# Patient Record
Sex: Female | Born: 1965 | Race: Black or African American | Hispanic: No | Marital: Single | Smoking: Current every day smoker
Health system: Southern US, Community
[De-identification: ages and names within clinical notes are randomized; demographics above are authoritative.]

## PROBLEM LIST (undated history)

## (undated) DIAGNOSIS — I10 Essential (primary) hypertension: Secondary | ICD-10-CM

## (undated) DIAGNOSIS — E669 Obesity, unspecified: Secondary | ICD-10-CM

## (undated) HISTORY — DX: Essential (primary) hypertension: I10

## (undated) HISTORY — DX: Obesity, unspecified: E66.9

---

## 2009-01-14 ENCOUNTER — Emergency Department (HOSPITAL_COMMUNITY): Admission: EM | Admit: 2009-01-14 | Discharge: 2009-01-14 | Payer: Self-pay | Admitting: Emergency Medicine

## 2009-11-26 ENCOUNTER — Telehealth: Payer: Self-pay | Admitting: Family Medicine
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
## 2011-04-14 ENCOUNTER — Ambulatory Visit: Payer: Self-pay | Admitting: Family Medicine

## 2011-05-06 ENCOUNTER — Encounter: Payer: Self-pay | Admitting: Family Medicine

## 2011-05-06 ENCOUNTER — Ambulatory Visit (INDEPENDENT_AMBULATORY_CARE_PROVIDER_SITE_OTHER): Payer: Self-pay | Admitting: Family Medicine

## 2011-05-06 VITALS — BP 172/99 | HR 91 | Temp 98.2°F | Ht 66.0 in | Wt 227.0 lb

## 2011-05-06 DIAGNOSIS — R209 Unspecified disturbances of skin sensation: Secondary | ICD-10-CM

## 2011-05-06 DIAGNOSIS — F17200 Nicotine dependence, unspecified, uncomplicated: Secondary | ICD-10-CM

## 2011-05-06 DIAGNOSIS — R202 Paresthesia of skin: Secondary | ICD-10-CM | POA: Insufficient documentation

## 2011-05-06 DIAGNOSIS — N926 Irregular menstruation, unspecified: Secondary | ICD-10-CM

## 2011-05-06 DIAGNOSIS — N939 Abnormal uterine and vaginal bleeding, unspecified: Secondary | ICD-10-CM | POA: Insufficient documentation

## 2011-05-06 DIAGNOSIS — I10 Essential (primary) hypertension: Secondary | ICD-10-CM

## 2011-05-06 DIAGNOSIS — Z720 Tobacco use: Secondary | ICD-10-CM

## 2011-05-06 DIAGNOSIS — Z716 Tobacco abuse counseling: Secondary | ICD-10-CM | POA: Insufficient documentation

## 2011-05-06 DIAGNOSIS — Z23 Encounter for immunization: Secondary | ICD-10-CM

## 2011-05-06 MED ORDER — LISINOPRIL-HYDROCHLOROTHIAZIDE 10-12.5 MG PO TABS: 1.0000 | ORAL_TABLET | Freq: Every day | ORAL | Status: DC

## 2011-05-06 NOTE — Patient Instructions (Addendum)
It was nice to meet you today!  I am giving you a new prescription to take to [facility] to have filled for your blood pressure. Please keep a record of all the blood pressure checks you get done between now and our next visit. I would like for you to come back to see me in 4 weeks for a follow-up.   I will get the records from [facility] and the health department to discuss at your next visit as well.  Please let me know if you need anything else!  Aldous Housel M. Fumiye Lubben, M.D.

## 2011-05-06 NOTE — Assessment & Plan Note (Signed)
Last pap smear was at the HD. We have requested those records be sent to [place]. Given this has been going on for 2 years, it is unlikely an acute process but her mother did die of cancer that began as cervical which is concerning. At her next visit, will consider doing a pelvic exam. Patient is not a candidate for birth control since she is 45 yo and smokes. Will continue to monitor.

## 2011-05-06 NOTE — Assessment & Plan Note (Signed)
Known history of HTN. BP was 172/99 in the office today. We discussed the effects of high blood pressure on her body and she understands. Encouraged her to change her diet, increase her exercise, decrease sodium and stop smoking. Started Lisinopril/HCT today which she will have filled at [facility]. Since she had recent labs at [facility], she signed a medical release form so we can get those results. We will discuss those at her next visit. If they are unable to send the results, we will need to draw basic labs. Since she is checking her BP at the drug store, I have asked her to record the measurements so we can track her progress. Other things to discuss with her will be nutrition consult and overall weight loss

## 2011-05-06 NOTE — Assessment & Plan Note (Signed)
Patient is ready to quit. She will try 1800QUITNOW until I see her back in 3-4 weeks. At that time, we will discuss the possibility of trying a medication or patch, depending on how much they cost. She is also interested in seeing Dr. Raymondo Band in pharmacy clinic which we can discuss after she cuts back more on her own. I feel she is very motivated to quit and make like changes.

## 2011-05-06 NOTE — Progress Notes (Signed)
Subjective:    Patient ID: Erica Cohen, female    DOB: 1966-06-07, 45 y.o.   MRN: [id number]  HPI  Patient presents to clinic today to establish care. She was previously not seen by a provider on a regular basis. She does have a history of hypertension but is not currently on medications. She has recently been to [facility] for abdominal pain, and had a pap smear done at the [facility]. Her concerns today are as follows:  1. Heavy menstrual bleeding: Pt states that in the last 2 years, her menstrual periods have become heavier with increased cramping. She states she has many large clots with her periods. The pain is so bad that it interferes with her daily activities. She has taken birth control in the past which helped with her periods. Her LMP was 10/6-10/10 which she states was heavy as well. She was seen at [facility] for abdominal pain a few weeks ago. They did an abdominal ultrasound (not pelvic) which was inconclusive, per the patient. She also had labs at that time but she is not aware of anything that was abnormal. Her last pap smear was at the health dept 6 months ago. Her mother did die of cervical cancer.  2. Hypertension: History of HTN diagnosed at age 26 during her first pregnancy. She was started on medications at that time, but she has not taken anything since she lost her health insurance. She does check her BP at local drug stores and she states it is always high. (172/102 yesterday at [facility]) She does notice headaches, blurred vision when her blood pressure is high. She states since she does not have any blood pressure medication, she has been taking her sister's medications but she is not sure what they are.   3. Arm tingling: Right arm only. Comes and goes. Usually 2-3 times per week and last a few minutes. She does not wake up in the middle of the night with hand pain. This is her entire arm. It feels like it "goes to sleep" then  resolves. She feels it is associated with high blood pressure.  4. Tobacco dependence: Patient smokes 6 cigarettes per day. She states she is ready to quit. She has been gradually cutting back. She states that she feels like since she has quit drugs and alcohol, she is able to quit smoking as well. She seems very motivated to do this.   Review of Systems  Constitutional: Negative for fever, activity change and appetite change.  HENT: Positive for congestion and rhinorrhea. Negative for neck pain.   Respiratory: Negative for cough and chest tightness.   Cardiovascular: Positive for leg swelling. Negative for chest pain.  Gastrointestinal: Negative for abdominal pain.  Genitourinary: Positive for vaginal bleeding.  Musculoskeletal: Negative for back pain.  Skin: Negative for rash.       Objective:   Physical Exam  Constitutional: She is oriented to person, place, and time. She appears well-developed and well-nourished. No distress.  HENT:  Head: Normocephalic and atraumatic.  Eyes: Pupils are equal, round, and reactive to light.  Neck: Normal range of motion. Neck supple.  Cardiovascular: Normal rate, regular rhythm and normal heart sounds.   No murmur heard. Pulmonary/Chest: Effort normal. No respiratory distress. She has no wheezes.  Abdominal: Soft. She exhibits no distension. There is no tenderness.  Musculoskeletal: Normal range of motion. She exhibits edema (1+ bilaterally).  Neurological: She is alert and oriented to person, place, and  time. No cranial nerve deficit. She exhibits normal muscle tone.  Skin: Skin is warm and dry. She is not diaphoretic.  Psychiatric: She has a normal mood and affect.          Assessment & Plan:

## 2011-05-06 NOTE — Assessment & Plan Note (Signed)
Did not do work-up today. Patient will get better control of BP then see how she is doing.

## 2011-06-08 ENCOUNTER — Ambulatory Visit: Payer: Self-pay | Admitting: Family Medicine

## 2011-06-10 ENCOUNTER — Emergency Department (HOSPITAL_COMMUNITY)
Admission: EM | Admit: 2011-06-10 | Discharge: 2011-06-10 | Disposition: A | Discharge status: Home / Self Care | Payer: No Typology Code available for payment source | Attending: Emergency Medicine | Admitting: Emergency Medicine

## 2011-06-10 ENCOUNTER — Encounter (HOSPITAL_COMMUNITY): Payer: Self-pay | Admitting: Nurse Practitioner

## 2011-06-10 DIAGNOSIS — Z79899 Other long term (current) drug therapy: Secondary | ICD-10-CM | POA: Insufficient documentation

## 2011-06-10 DIAGNOSIS — M542 Cervicalgia: Secondary | ICD-10-CM | POA: Insufficient documentation

## 2011-06-10 DIAGNOSIS — F29 Unspecified psychosis not due to a substance or known physiological condition: Secondary | ICD-10-CM | POA: Insufficient documentation

## 2011-06-10 DIAGNOSIS — T148XXA Other injury of unspecified body region, initial encounter: Secondary | ICD-10-CM

## 2011-06-10 DIAGNOSIS — S139XXA Sprain of joints and ligaments of unspecified parts of neck, initial encounter: Secondary | ICD-10-CM | POA: Insufficient documentation

## 2011-06-10 DIAGNOSIS — I10 Essential (primary) hypertension: Secondary | ICD-10-CM | POA: Insufficient documentation

## 2011-06-10 MED ORDER — IBUPROFEN 800 MG PO TABS: 800.0000 mg | ORAL_TABLET | Freq: Three times a day (TID) | ORAL | Status: AC | PRN

## 2011-06-10 MED ORDER — TRAMADOL HCL 50 MG PO TABS: 50.0000 mg | ORAL_TABLET | Freq: Four times a day (QID) | ORAL | Status: AC | PRN

## 2011-06-10 MED ORDER — TRAMADOL HCL 50 MG PO TABS: 50.0000 mg | ORAL_TABLET | Freq: Four times a day (QID) | ORAL | Status: DC | PRN

## 2011-06-10 MED ORDER — METHOCARBAMOL 500 MG PO TABS: 1000.0000 mg | ORAL_TABLET | Freq: Four times a day (QID) | ORAL | Status: AC
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
## 2011-06-10 NOTE — ED Provider Notes (Signed)
Medical screening examination/treatment/procedure(s) were performed by non-physician practitioner and as supervising physician I was immediately available for consultation/collaboration.  Jedd Schulenburg, MD 06/10/11 2121 
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
## 2011-07-05 ENCOUNTER — Ambulatory Visit: Payer: Self-pay | Admitting: Family Medicine

## 2011-07-16 ENCOUNTER — Ambulatory Visit: Payer: Self-pay | Admitting: Family Medicine

## 2011-07-27 LAB — HM MAMMOGRAPHY

## 2011-10-29 ENCOUNTER — Ambulatory Visit (INDEPENDENT_AMBULATORY_CARE_PROVIDER_SITE_OTHER): Payer: No Typology Code available for payment source | Admitting: Family Medicine

## 2011-10-29 ENCOUNTER — Telehealth: Payer: Self-pay | Admitting: Family Medicine

## 2011-10-29 ENCOUNTER — Encounter: Payer: Self-pay | Admitting: Family Medicine

## 2011-10-29 ENCOUNTER — Ambulatory Visit (HOSPITAL_COMMUNITY)
Admission: RE | Admit: 2011-10-29 | Discharge: 2011-10-29 | Disposition: A | Discharge status: Home / Self Care | Payer: No Typology Code available for payment source | Source: Ambulatory Visit | Attending: Family Medicine | Admitting: Family Medicine

## 2011-10-29 ENCOUNTER — Other Ambulatory Visit: Payer: Self-pay

## 2011-10-29 VITALS — BP 132/86 | HR 82 | Ht 66.0 in | Wt 232.0 lb

## 2011-10-29 DIAGNOSIS — R079 Chest pain, unspecified: Secondary | ICD-10-CM | POA: Insufficient documentation

## 2011-10-29 DIAGNOSIS — R05 Cough: Secondary | ICD-10-CM | POA: Insufficient documentation

## 2011-10-29 DIAGNOSIS — I10 Essential (primary) hypertension: Secondary | ICD-10-CM | POA: Insufficient documentation

## 2011-10-29 DIAGNOSIS — Z87891 Personal history of nicotine dependence: Secondary | ICD-10-CM | POA: Insufficient documentation

## 2011-10-29 DIAGNOSIS — R059 Cough, unspecified: Secondary | ICD-10-CM | POA: Insufficient documentation

## 2011-10-29 DIAGNOSIS — R0789 Other chest pain: Secondary | ICD-10-CM | POA: Insufficient documentation

## 2011-10-29 DIAGNOSIS — R9431 Abnormal electrocardiogram [ECG] [EKG]: Secondary | ICD-10-CM | POA: Insufficient documentation

## 2011-10-29 MED ORDER — LISINOPRIL-HYDROCHLOROTHIAZIDE 10-12.5 MG PO TABS: 1.0000 | ORAL_TABLET | Freq: Every day | ORAL | Status: DC

## 2011-10-29 MED ORDER — TRAMADOL HCL 50 MG PO TABS: 50.0000 mg | ORAL_TABLET | Freq: Four times a day (QID) | ORAL | Status: AC | PRN
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
## 2011-10-29 NOTE — Progress Notes (Signed)
Patient ID: Erica Cohen, female   DOB: 10/13/1965, 46 y.o.   MRN: [id number] Patient ID: Erica Cohen    DOB: 1966/05/02, 46 y.o.   MRN: [id number] --- Subjective:  Erica Cohen is a 46 y.o.female with h/o HTN who presents with 2 week history of right sided chest pain. It is sharp shooting, lasts a couple seconds and goes away. It is not worst with movement or exercise. She actually doesn't feel any pain when she works out on. The pain radiates under her right breast to her back. Pain is worst at night and keeps her up. She has not been able to sleep due to the pain. The pain wakes her up at night. Occurs at least 3-4 times daily. She denies any trauma to the chest, any abnormal movement recently. She has been taking ibuprofen 800mg  three times daily for 2 weeks. Ibuprofen helps pain subside but pain does recur.  She denies any nausea, vomiting, diaphoresis.  She has also been having some associated shortness of breath when the pain happens. No shortness of breath with exercise. No fevers or chills. No sinus congestion, no cough.  She denies any anxiety. Denies any symptoms of reflux or indigestion. No abdominal pain.    Objective: Filed Vitals:   10/29/11 1047  BP: 132/86  Pulse: 82    Physical Examination:   General appearance - alert, well appearing, and in no distress Nose - normal and patent, no erythema, discharge or polyps Mouth - mucous membranes moist, pharynx normal without lesions Neck - supple, no significant adenopathy Chest - clear to auscultation, no wheezes, rales or rhonchi, symmetric air entry.  Heart - normal rate, regular rhythm, normal S1, S2, no murmurs, rubs, clicks or gallops, Tenderness to palpation along right costochondral junction.  Abdomen - soft, nontender, nondistended, no masses or organomegaly Extremities - trace pedal edema

## 2011-10-29 NOTE — Patient Instructions (Addendum)
I am sorry that you are having this chest pain. It is most likely musculoskeletal, but we want to make sure that it isn't anything with your heart and your lungs.  The EKG looked normal.  I am sending you to get a chest xray. We will let you know if there's anything abnormal.   For the pain, I am sending in ultram to take every 6hrs as needed for pain. You can take 1 to 2 pills each time. Continue with tylenol 650mg  every 6 hours.   If the pain gets worst, if you are short of breath or in pain during exercise or light activity, if you have nausea, vomiting, please return to the clinic or to the ED.

## 2011-10-29 NOTE — Telephone Encounter (Signed)
Called patient to inform her that cxr was normal, but had a wrong number and could not reach her.

## 2011-10-29 NOTE — Assessment & Plan Note (Addendum)
Differential includes: costochondritis (most likely), pulmonary process such as pneumonia (although lack of fever and lack of focal findings on physical exam make it less likely) or pneumothorax, cardiac process (patient does have a remote history of "congestion around the heart" that she mentions, but no h/o CAD, although cardiac chest pain can present itself differently in women).  - EKG in office showed normal sinus, rate 60's, non specific T wave depressions in leads V1 and aVR but otherwise normal. No evidence of ischemia. No previous EKG for comparison available.  - will obtain CXR to rule out acute pulmonary process. - treat pain with ultram 50-100mg  q6hr and tylenol - close follow up with PCP

## 2011-12-07 ENCOUNTER — Ambulatory Visit: Payer: No Typology Code available for payment source | Admitting: Family Medicine

## 2012-01-07 ENCOUNTER — Ambulatory Visit: Payer: No Typology Code available for payment source

## 2012-01-28 ENCOUNTER — Ambulatory Visit (INDEPENDENT_AMBULATORY_CARE_PROVIDER_SITE_OTHER): Payer: Self-pay | Admitting: Family Medicine

## 2012-01-28 ENCOUNTER — Encounter: Payer: Self-pay | Admitting: Family Medicine

## 2012-01-28 VITALS — BP 142/91 | HR 90 | Temp 98.2°F | Ht 66.0 in | Wt 226.0 lb

## 2012-01-28 DIAGNOSIS — N949 Unspecified condition associated with female genital organs and menstrual cycle: Secondary | ICD-10-CM

## 2012-01-28 DIAGNOSIS — I10 Essential (primary) hypertension: Secondary | ICD-10-CM

## 2012-01-28 DIAGNOSIS — Z111 Encounter for screening for respiratory tuberculosis: Secondary | ICD-10-CM

## 2012-01-28 DIAGNOSIS — N938 Other specified abnormal uterine and vaginal bleeding: Secondary | ICD-10-CM | POA: Insufficient documentation

## 2012-01-28 DIAGNOSIS — Z0000 Encounter for general adult medical examination without abnormal findings: Secondary | ICD-10-CM

## 2012-01-28 MED ORDER — LISINOPRIL-HYDROCHLOROTHIAZIDE 10-12.5 MG PO TABS: 1.0000 | ORAL_TABLET | Freq: Every day | ORAL | Status: DC

## 2012-01-28 MED ORDER — MEDROXYPROGESTERONE ACETATE 10 MG PO TABS: 10.0000 mg | ORAL_TABLET | Freq: Every day | ORAL | Status: DC

## 2012-01-28 NOTE — Patient Instructions (Signed)
It was great to see you today!  Please come back Monday to have your TB test read. You can start taking the Provera once per day for 10 days. If your bleeding worsens or if you have any concerns, please return to the office for same day appointment. Otherwise, come back to see me in 2-3 months. We will do a complete physical at that time.  I have sent your prescriptions to [facility].  Have a great weekend! Ylonda Storr M. Cera Rorke, M.D.

## 2012-01-28 NOTE — Assessment & Plan Note (Signed)
Patient bleeding for 3 weeks. Will give Provera 10mg  po daily x10 days. She had ultrasound last fall, but if her bleeding continues she may likely need repeat transvaginal ultrasound, as well as a pelvic exam with possible endometrial biopsy. I have discussed this with patient and she agrees. Will follow up with me in 3 months.

## 2012-01-28 NOTE — Progress Notes (Signed)
Subjective:     Patient ID: Erica Cohen, female   DOB: [birth year], 46 y.o.   MRN: [id number]  HPI  1. HTN- Improved. No headaches, changes in vision or swelling in legs. No flushing. Lisinopril-HCTZ seems to be working well. Checks at home, usually 120's-140's systolic. Out of Rx right now, and requesting refill. Patient's BP 142/91 in the office today without medication. She also recently joined a gym and is working out a few times per week up to one hour at a time. She has lost a few pounds recently and she is happy about that.   2. Menorrhagia- Period started June 16 and has been bleeding since then. Some cramping, but overall not painful. Feels tired and unable to sleep, but she states this is due to stress more than her bleeding. She was seen by me >6 months ago with a bleeding episode. At that time she had an ultrasound at the ED, but nothing was done. Her bleeding stopped and she has had some regular months since then. The current episode started 3 weeks ago. She feels like she would feel better overall if she were not bleeding.   3. Health Maintenance- Needs mammogram. Will order today. Patient also needs Tb test. Will return on Monday, July 8 to have test read.   History reviewed: current everyday smoker  Review of Systems Please see HPI above    Objective:   Physical Exam  Constitutional: She appears well-developed and well-nourished. No distress.  HENT:  Head: Normocephalic and atraumatic.  Cardiovascular: Normal rate, regular rhythm and normal heart sounds.   Pulmonary/Chest: Effort normal and breath sounds normal.  Abdominal: There is no tenderness.  Musculoskeletal: Normal range of motion. She exhibits no edema.     Assessment:     46 yo F with HTN presenting for menorrhagia     Plan:

## 2012-01-28 NOTE — Assessment & Plan Note (Signed)
Refilled Lisinopril-HCTZ today. Patient is happy with medication, and she has been doing well with her blood pressure recently. Encouraged her to let me know when she is out of Rx so we can refill it. Will follow up in 3 months.

## 2012-01-31 ENCOUNTER — Ambulatory Visit (INDEPENDENT_AMBULATORY_CARE_PROVIDER_SITE_OTHER): Payer: Self-pay | Admitting: *Deleted

## 2012-01-31 DIAGNOSIS — Z111 Encounter for screening for respiratory tuberculosis: Secondary | ICD-10-CM

## 2012-01-31 DIAGNOSIS — IMO0001 Reserved for inherently not codable concepts without codable children: Secondary | ICD-10-CM

## 2012-01-31 LAB — TB SKIN TEST: TB Skin Test: NEGATIVE

## 2012-02-21 ENCOUNTER — Ambulatory Visit
Admission: RE | Admit: 2012-02-21 | Discharge: 2012-02-21 | Disposition: A | Discharge status: Home / Self Care | Payer: Self-pay | Source: Ambulatory Visit | Attending: Family Medicine | Admitting: Family Medicine

## 2012-02-21 DIAGNOSIS — Z0000 Encounter for general adult medical examination without abnormal findings: Secondary | ICD-10-CM

## 2012-02-23 ENCOUNTER — Telehealth: Payer: Self-pay | Admitting: Family Medicine

## 2012-02-23 DIAGNOSIS — N939 Abnormal uterine and vaginal bleeding, unspecified: Secondary | ICD-10-CM

## 2012-02-23 NOTE — Telephone Encounter (Signed)
Patient is calling because the medication that was given to stop her cycle stopped it for about 2 days and then it started again and she is clotting and cramping really bad.  She would like to speak to Dr. Mikel Cella about what to do next.

## 2012-02-24 ENCOUNTER — Telehealth: Payer: Self-pay | Admitting: *Deleted

## 2012-02-24 ENCOUNTER — Other Ambulatory Visit: Payer: Self-pay | Admitting: *Deleted

## 2012-02-24 DIAGNOSIS — N926 Irregular menstruation, unspecified: Secondary | ICD-10-CM

## 2012-02-24 DIAGNOSIS — N939 Abnormal uterine and vaginal bleeding, unspecified: Secondary | ICD-10-CM

## 2012-02-24 MED ORDER — TRAMADOL HCL 50 MG PO TABS: 50.0000 mg | ORAL_TABLET | Freq: Three times a day (TID) | ORAL | Status: AC | PRN

## 2012-02-24 MED ORDER — TRAMADOL HCL 50 MG PO TABS: 50.0000 mg | ORAL_TABLET | Freq: Three times a day (TID) | ORAL | Status: DC | PRN

## 2012-02-24 NOTE — Telephone Encounter (Signed)
Called patient and informed her of appointment for [place] at [facility] on 02/29/12. Patient was instructed to drink 32oz of water, bring a picture ID, and insurance card to visit. Also asked her to call front desk and schedule next available appointment with Dr Mikel Cella.Johni Narine, Rodena Medin

## 2012-02-24 NOTE — Telephone Encounter (Signed)
I have ordered an ultrasound for her. Please have her come in for an office visit with me as soon as she can. Thank you! Annett Boxwell M. Tyrees Chopin, M.D.

## 2012-02-24 NOTE — Telephone Encounter (Signed)
Sent in Rx for tramadol, if she would like to take that.  She can take Ibuprofen 600mg  (3 tabs) every 4 hours, if needed. She can also alternate Ibuprofen with Tylenol.  If her pain is too severe and/or if she thinks she is bleeding excessively, she can be evaluated by another doctor in our office sooner, or go to [facility] (as a last option.) Thank you. Caitlynn Ju M. Tamiki Kuba, M.D.

## 2012-02-24 NOTE — Telephone Encounter (Signed)
Spoke with pt.  Appt scheduled for 8.30.13  Pt states that is is in major pain and is taking a lot of ibuprofen and wants to know if the MD can call in something for her.  Pt states that she is taking 2-3 ibuprofen every 2-3 hours.  Advised that this was way too much ibuprofen and I would send a message to MD to see what she wanted to do. Advaith Lamarque, [name]

## 2012-02-24 NOTE — Telephone Encounter (Signed)
Called to clarify with MD.  The 8th is ok for the ultrasound, she is to schedule appt with her sometimes after the ultrasound.  Erica Cohen called and LMOVM for pt to call back .[name], [name]

## 2012-02-24 NOTE — Telephone Encounter (Signed)
Pt informed and agreeable.  She had asked me to send it to [facility] in [place] but I forgot to relay that message to MD.  I changed the Rx hopefully it will go thru.  Pt informed. Erica Cohen, Erica Cohen

## 2012-02-29 ENCOUNTER — Ambulatory Visit (HOSPITAL_COMMUNITY): Payer: Self-pay

## 2012-03-02 ENCOUNTER — Ambulatory Visit (HOSPITAL_COMMUNITY)
Admission: RE | Admit: 2012-03-02 | Discharge: 2012-03-02 | Disposition: A | Discharge status: Home / Self Care | Payer: Self-pay | Source: Ambulatory Visit | Attending: Family Medicine | Admitting: Family Medicine

## 2012-03-02 DIAGNOSIS — N926 Irregular menstruation, unspecified: Secondary | ICD-10-CM

## 2012-03-02 DIAGNOSIS — D259 Leiomyoma of uterus, unspecified: Secondary | ICD-10-CM | POA: Insufficient documentation

## 2012-03-02 DIAGNOSIS — N838 Other noninflammatory disorders of ovary, fallopian tube and broad ligament: Secondary | ICD-10-CM | POA: Insufficient documentation

## 2012-03-02 DIAGNOSIS — N949 Unspecified condition associated with female genital organs and menstrual cycle: Secondary | ICD-10-CM | POA: Insufficient documentation

## 2012-03-02 DIAGNOSIS — N939 Abnormal uterine and vaginal bleeding, unspecified: Secondary | ICD-10-CM

## 2012-03-02 DIAGNOSIS — N938 Other specified abnormal uterine and vaginal bleeding: Secondary | ICD-10-CM | POA: Insufficient documentation

## 2012-03-02 DIAGNOSIS — N83209 Unspecified ovarian cyst, unspecified side: Secondary | ICD-10-CM | POA: Insufficient documentation

## 2012-03-06 ENCOUNTER — Telehealth: Payer: Self-pay | Admitting: Family Medicine

## 2012-03-06 DIAGNOSIS — N939 Abnormal uterine and vaginal bleeding, unspecified: Secondary | ICD-10-CM

## 2012-03-06 NOTE — Telephone Encounter (Signed)
Attempted to call patient with results. Reached voicemail, and left message to call blue team. When patient calls back please let her know that her ultrasound was normal with the exception of a uterine fibroid. This could be the cause of her bleeding. She can either be seen at [facility] clinic for their opinion, or I can place an IUD for symptomatic control of her bleeding. I will be available tomorrow to discuss this with her more, if needed. Thank you! Kearston Putman M. Edith Lord, M.D.

## 2012-03-06 NOTE — Telephone Encounter (Signed)
Pt informed but she has additional questions that I am unable to answer.  Advised pt of this and she is ok with waiting for callback from MD tomorrow.  Will forward to Dr. Mikel Cella.   FYITax adviser, if pt chooses to try the IUD she would have to complete a scholarship application to see if she qualifies.  Fleeger, [name]

## 2012-03-06 NOTE — Telephone Encounter (Signed)
Message copied by Hilarie Fredrickson on Mon Mar 06, 2012 11:54 AM ------      Message from: Tivis Ringer      Created: Thu Mar 02, 2012 12:23 PM                   ----- Message -----         From: Rad Results In Interface         Sent: 03/02/2012  11:25 AM           To: Sanjuana Letters, MD

## 2012-03-08 NOTE — Telephone Encounter (Signed)
Placed in [facility] clinics workqueue. Fleeger, Erica Cohen

## 2012-03-08 NOTE — Telephone Encounter (Signed)
Discussed results with patient. She continues to have bleeding and pain, mostly on her right side. She states it is to the point now that it is interfering with her life. She did not go on family vacation this year because of it. She would like to be seen at [facility] first. Will place order. Please let patient know when the appointment is made. Thank you! Jaiah Weigel M. Selma Mink, M.D.

## 2012-03-09 ENCOUNTER — Encounter: Payer: Self-pay | Admitting: Advanced Practice Midwife

## 2012-03-24 ENCOUNTER — Ambulatory Visit: Payer: Self-pay | Admitting: Family Medicine

## 2012-03-31 ENCOUNTER — Encounter: Payer: Self-pay | Admitting: Advanced Practice Midwife

## 2012-04-19 ENCOUNTER — Other Ambulatory Visit (HOSPITAL_COMMUNITY)
Admission: RE | Admit: 2012-04-19 | Discharge: 2012-04-19 | Disposition: A | Discharge status: Home / Self Care | Payer: Self-pay | Source: Ambulatory Visit | Attending: Obstetrics & Gynecology | Admitting: Obstetrics & Gynecology

## 2012-04-19 ENCOUNTER — Ambulatory Visit (INDEPENDENT_AMBULATORY_CARE_PROVIDER_SITE_OTHER): Payer: Self-pay | Admitting: Obstetrics & Gynecology

## 2012-04-19 ENCOUNTER — Encounter: Payer: Self-pay | Admitting: Obstetrics & Gynecology

## 2012-04-19 VITALS — BP 146/83 | HR 80 | Ht 66.0 in | Wt 234.8 lb

## 2012-04-19 DIAGNOSIS — N938 Other specified abnormal uterine and vaginal bleeding: Secondary | ICD-10-CM

## 2012-04-19 DIAGNOSIS — N949 Unspecified condition associated with female genital organs and menstrual cycle: Secondary | ICD-10-CM

## 2012-04-19 LAB — CBC
Hemoglobin: 12.2 g/dL (ref 12.0–15.0)
MCH: 28.5 pg (ref 26.0–34.0)
MCHC: 32.9 g/dL (ref 30.0–36.0)
MCV: 86.7 fL (ref 78.0–100.0)

## 2012-04-19 NOTE — Progress Notes (Signed)
  Subjective:    Patient ID: Erica Cohen, female    DOB: 1966-05-28, 46 y.o.   MRN: [id number]  HPI  46 yo S AA lady who is here because of several episodes over the last few months of very heavy bleeding episodes. She does not use birth control when she has sex (rare). She is convinced that she won't get pregnant. Review of Systems Mammogram recently normal reportedly. She lives with her 80 yo daughter and her grandchildren.    Objective:   Physical Exam UPT negative, consent signed, time out done. Cervix prepped with betadine Pipelle used for 3 passes with a moderate/large amount of tissue. She was having her period today.   IMPRESSION:  Focal fibroid with size and location as noted above. A small sub  mucosal component is suspected.  No focal endometrial abnormality is otherwise appreciated.  Benign appearing right ovarian cysts. Given the cyst size,  appearance and premenopausal status, these are likely functional  and no further follow-up is needed.      Assessment & Plan:  Menorrhagia- possibly due to her fibroid I will get labs and then consider a Mirena

## 2012-04-19 NOTE — Addendum Note (Signed)
Addended by: Freddi Starr on: 04/19/2012 04:49 PM   Modules accepted: Orders

## 2012-04-20 LAB — TSH: TSH: 2.033 u[IU]/mL (ref 0.350–4.500)

## 2012-05-22 ENCOUNTER — Encounter: Payer: Self-pay | Admitting: Obstetrics & Gynecology

## 2012-05-22 ENCOUNTER — Ambulatory Visit (INDEPENDENT_AMBULATORY_CARE_PROVIDER_SITE_OTHER): Payer: Self-pay | Admitting: Obstetrics & Gynecology

## 2012-05-22 VITALS — BP 147/88 | HR 91 | Temp 99.2°F | Ht 66.0 in | Wt 237.9 lb

## 2012-05-22 DIAGNOSIS — N938 Other specified abnormal uterine and vaginal bleeding: Secondary | ICD-10-CM

## 2012-05-22 DIAGNOSIS — Z23 Encounter for immunization: Secondary | ICD-10-CM

## 2012-05-22 DIAGNOSIS — N949 Unspecified condition associated with female genital organs and menstrual cycle: Secondary | ICD-10-CM

## 2012-05-22 MED ORDER — INFLUENZA VIRUS VACC SPLIT PF IM SUSP
0.5000 mL | Freq: Once | INTRAMUSCULAR | Status: AC
  Administered 2012-05-22: 0.5 mL via INTRAMUSCULAR

## 2012-05-22 NOTE — Progress Notes (Signed)
  Subjective:    Patient ID: Erica Cohen, female    DOB: [birth year], 46 y.o.   MRN: [id number]  HPI  46 yo lady with DUB. Her lab studies were normal. I have offered her depo provera, Mirena, and Novasure endometrial ablation. After careful consideration, she has chosen endometrial ablation. I have quoted her a 90% satisfaction rate. She will have her flu vaccine today.   Review of Systems     Objective:   Physical Exam        Assessment & Plan:   As above

## 2012-06-26 ENCOUNTER — Encounter: Payer: Self-pay | Admitting: Home Health Services

## 2012-09-29 ENCOUNTER — Ambulatory Visit: Payer: Self-pay | Admitting: Family

## 2012-10-11 ENCOUNTER — Ambulatory Visit (INDEPENDENT_AMBULATORY_CARE_PROVIDER_SITE_OTHER): Payer: BC Managed Care – PPO | Admitting: Family

## 2012-10-11 ENCOUNTER — Encounter: Payer: Self-pay | Admitting: Family

## 2012-10-11 VITALS — BP 120/84 | HR 78 | Temp 98.3°F | Resp 14 | Wt 239.0 lb

## 2012-10-11 DIAGNOSIS — Z7189 Other specified counseling: Secondary | ICD-10-CM

## 2012-10-11 DIAGNOSIS — M545 Low back pain, unspecified: Secondary | ICD-10-CM

## 2012-10-11 DIAGNOSIS — Z716 Tobacco abuse counseling: Secondary | ICD-10-CM

## 2012-10-11 DIAGNOSIS — R519 Headache, unspecified: Secondary | ICD-10-CM | POA: Insufficient documentation

## 2012-10-11 DIAGNOSIS — R51 Headache: Secondary | ICD-10-CM

## 2012-10-11 DIAGNOSIS — M79604 Pain in right leg: Secondary | ICD-10-CM | POA: Insufficient documentation

## 2012-10-11 DIAGNOSIS — N939 Abnormal uterine and vaginal bleeding, unspecified: Secondary | ICD-10-CM

## 2012-10-11 DIAGNOSIS — N938 Other specified abnormal uterine and vaginal bleeding: Secondary | ICD-10-CM

## 2012-10-11 DIAGNOSIS — I10 Essential (primary) hypertension: Secondary | ICD-10-CM

## 2012-10-11 DIAGNOSIS — N949 Unspecified condition associated with female genital organs and menstrual cycle: Secondary | ICD-10-CM

## 2012-10-11 DIAGNOSIS — F17200 Nicotine dependence, unspecified, uncomplicated: Secondary | ICD-10-CM

## 2012-10-11 DIAGNOSIS — N926 Irregular menstruation, unspecified: Secondary | ICD-10-CM

## 2012-10-11 LAB — BASIC METABOLIC PANEL
CO2: 24 mEq/L (ref 19–32)
Calcium: 9.9 mg/dL (ref 8.4–10.5)
Chloride: 104 mEq/L (ref 96–112)
Glucose, Bld: 86 mg/dL (ref 70–99)
Sodium: 137 mEq/L (ref 135–145)

## 2012-10-11 MED ORDER — METHYLPREDNISOLONE 4 MG PO KIT: PACK | ORAL | Status: DC

## 2012-10-11 MED ORDER — LISINOPRIL-HYDROCHLOROTHIAZIDE 10-12.5 MG PO TABS: 1.0000 | ORAL_TABLET | Freq: Every day | ORAL | Status: DC

## 2012-10-11 NOTE — Assessment & Plan Note (Signed)
Trial of PRN ibuprofen.

## 2012-10-11 NOTE — Assessment & Plan Note (Signed)
Clinically stable on lisinopril/hctz. Obtain BMET. Will plan to discuss various birth control options at next visit.

## 2012-10-11 NOTE — Assessment & Plan Note (Signed)
Pt counseled on smoking cessation x 5 minutes. She would like to try the nicotine patch.
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
## 2012-10-11 NOTE — Patient Instructions (Addendum)
Please schedule a 1 month follow up appointment for mole removal.  Welcome to [facility]!

## 2012-10-11 NOTE — Assessment & Plan Note (Deleted)
Will refer to GYN for further evaluation.  

## 2012-10-11 NOTE — Assessment & Plan Note (Signed)
Refer to GYN for further evaluation

## 2012-10-11 NOTE — Assessment & Plan Note (Signed)
Trial of medrol dose pak.  If no improvement, consider MRI of the L-Spine.

## 2012-10-12 ENCOUNTER — Encounter: Payer: Self-pay | Admitting: Family

## 2012-10-16 ENCOUNTER — Ambulatory Visit: Payer: Self-pay | Admitting: Pharmacist

## 2012-11-08 ENCOUNTER — Ambulatory Visit: Payer: BC Managed Care – PPO | Admitting: Family

## 2012-11-22 ENCOUNTER — Telehealth: Payer: Self-pay | Admitting: Family

## 2012-11-22 NOTE — Telephone Encounter (Signed)
Notified pt. She states she is not using birth control at present. Just saw GYN and is going to go back to have biposy taken. States that she is not currently sexually active. Advised her to use condoms if she becomes sexually active until she obtains Rx through GYN.

## 2012-11-22 NOTE — Telephone Encounter (Signed)
Left message requesting that pt return our call.  When she calls back please let her know that I was reviewing her med list and wanted to make sure that she is aware not to become pregnant while using lisinopril as this med is unsafe during pregnancy.  What is she using for birth control?

## 2013-01-09 ENCOUNTER — Other Ambulatory Visit: Payer: Self-pay | Admitting: Family

## 2013-01-09 ENCOUNTER — Encounter: Payer: Self-pay | Admitting: Family

## 2013-01-09 ENCOUNTER — Ambulatory Visit (INDEPENDENT_AMBULATORY_CARE_PROVIDER_SITE_OTHER): Payer: BC Managed Care – PPO | Admitting: Family

## 2013-01-09 VITALS — BP 128/80 | HR 77 | Temp 98.2°F | Wt 238.4 lb

## 2013-01-09 DIAGNOSIS — D229 Melanocytic nevi, unspecified: Secondary | ICD-10-CM

## 2013-01-09 DIAGNOSIS — Z111 Encounter for screening for respiratory tuberculosis: Secondary | ICD-10-CM

## 2013-01-09 DIAGNOSIS — D239 Other benign neoplasm of skin, unspecified: Secondary | ICD-10-CM

## 2013-01-09 NOTE — Progress Notes (Signed)
  Subjective:    Patient ID: Erica Cohen, female    DOB: 12-03-1965, 47 y.o.   MRN: [id number]  HPI  Erica Cohen is a 47 yr old female who presents today for mole removal.   1) Mole- notes pruritic mole on the left posterior calf.  She is also requesting PPD placement and drug testing for her job.      Review of Systems    see HPI  Objective:   Physical Exam  Constitutional:  Morbidly obese AA female, awake, alert, NAD  Skin:  Raised, dry approx 4 mm wide mole noted on left posterior calf, greyish is color.   Psychiatric: She has a normal mood and affect. Her speech is normal and behavior is normal. Thought content normal.          Assessment & Plan:

## 2013-01-09 NOTE — Patient Instructions (Addendum)
You will be contacted about your referral to nutrition. Please return on Thursday for PPD reading. Keep area on leg clean and dry x 24 hours. Call if redness/swelling occurs.

## 2013-01-09 NOTE — Assessment & Plan Note (Signed)
Procedure including risks/benefits explained to patient.  Questions were answered. After informed consent was obtained and a time out completed, site was cleansed with betadine and then alcohol. 1% Lidocaine with epinephrine was injected under lesion and then shave biopsy was performed. Area was cauterized to obtain hemostasis.  Pt tolerated procedure well.  Specimen sent for pathology review.  Pt instructed to keep the area dry for 24 hours and to contact us if he develops redness, drainage or swelling at the site.  Pt may use tylenol as needed for discomfort today.    

## 2013-01-11 ENCOUNTER — Other Ambulatory Visit: Payer: Self-pay | Admitting: Family

## 2013-01-11 ENCOUNTER — Encounter: Payer: Self-pay | Admitting: *Deleted

## 2013-01-13 ENCOUNTER — Encounter: Payer: Self-pay | Admitting: Family

## 2013-02-08 ENCOUNTER — Ambulatory Visit: Payer: BC Managed Care – PPO | Admitting: *Deleted

## 2013-02-23 HISTORY — PX: OTHER SURGICAL HISTORY: SHX169

## 2013-03-19 ENCOUNTER — Ambulatory Visit (HOSPITAL_COMMUNITY): Payer: BC Managed Care – PPO | Admitting: Psychiatry

## 2013-03-20 ENCOUNTER — Ambulatory Visit (HOSPITAL_COMMUNITY): Payer: BC Managed Care – PPO | Admitting: Psychiatry

## 2013-04-09 ENCOUNTER — Ambulatory Visit (INDEPENDENT_AMBULATORY_CARE_PROVIDER_SITE_OTHER): Payer: BC Managed Care – PPO | Admitting: Psychiatry

## 2013-04-09 DIAGNOSIS — F329 Major depressive disorder, single episode, unspecified: Secondary | ICD-10-CM

## 2013-04-09 DIAGNOSIS — F3289 Other specified depressive episodes: Secondary | ICD-10-CM

## 2013-04-09 DIAGNOSIS — F32A Depression, unspecified: Secondary | ICD-10-CM

## 2013-04-10 ENCOUNTER — Encounter (HOSPITAL_COMMUNITY): Payer: Self-pay | Admitting: Psychiatry
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
## 2013-04-25 ENCOUNTER — Ambulatory Visit (HOSPITAL_COMMUNITY): Payer: Self-pay | Admitting: Psychiatry

## 2013-09-23 LAB — HM PAP SMEAR

## 2013-10-29 ENCOUNTER — Ambulatory Visit (INDEPENDENT_AMBULATORY_CARE_PROVIDER_SITE_OTHER): Payer: BC Managed Care – PPO | Admitting: *Deleted

## 2013-10-29 DIAGNOSIS — Z111 Encounter for screening for respiratory tuberculosis: Secondary | ICD-10-CM

## 2013-10-31 ENCOUNTER — Encounter: Payer: Self-pay | Admitting: *Deleted

## 2013-10-31 LAB — TB SKIN TEST
Induration: 0 mm
TB Skin Test: NEGATIVE

## 2013-11-09 ENCOUNTER — Ambulatory Visit (INDEPENDENT_AMBULATORY_CARE_PROVIDER_SITE_OTHER): Payer: BC Managed Care – PPO | Admitting: Family

## 2013-11-09 ENCOUNTER — Other Ambulatory Visit: Payer: Self-pay | Admitting: Family

## 2013-11-09 ENCOUNTER — Encounter: Payer: Self-pay | Admitting: Family

## 2013-11-09 VITALS — BP 118/82 | HR 69 | Temp 98.2°F | Resp 16 | Ht 66.0 in | Wt 238.1 lb

## 2013-11-09 DIAGNOSIS — I10 Essential (primary) hypertension: Secondary | ICD-10-CM

## 2013-11-09 DIAGNOSIS — G4700 Insomnia, unspecified: Secondary | ICD-10-CM | POA: Insufficient documentation

## 2013-11-09 DIAGNOSIS — Z1231 Encounter for screening mammogram for malignant neoplasm of breast: Secondary | ICD-10-CM

## 2013-11-09 DIAGNOSIS — F32A Depression, unspecified: Secondary | ICD-10-CM | POA: Insufficient documentation

## 2013-11-09 DIAGNOSIS — F329 Major depressive disorder, single episode, unspecified: Secondary | ICD-10-CM

## 2013-11-09 DIAGNOSIS — F3289 Other specified depressive episodes: Secondary | ICD-10-CM

## 2013-11-09 DIAGNOSIS — Z0000 Encounter for general adult medical examination without abnormal findings: Secondary | ICD-10-CM | POA: Insufficient documentation

## 2013-11-09 DIAGNOSIS — Z23 Encounter for immunization: Secondary | ICD-10-CM

## 2013-11-09 LAB — CBC WITH DIFFERENTIAL/PLATELET
BASOS PCT: 0 % (ref 0–1)
Basophils Absolute: 0 10*3/uL (ref 0.0–0.1)
Eosinophils Absolute: 0.1 10*3/uL (ref 0.0–0.7)
Eosinophils Relative: 2 % (ref 0–5)
HCT: 39.4 % (ref 36.0–46.0)
HEMOGLOBIN: 13.6 g/dL (ref 12.0–15.0)
Lymphocytes Relative: 30 % (ref 12–46)
Lymphs Abs: 2.1 10*3/uL (ref 0.7–4.0)
MCH: 29.8 pg (ref 26.0–34.0)
MCHC: 34.5 g/dL (ref 30.0–36.0)
MCV: 86.2 fL (ref 78.0–100.0)
Monocytes Absolute: 0.4 10*3/uL (ref 0.1–1.0)
Monocytes Relative: 6 % (ref 3–12)
NEUTROS ABS: 4.3 10*3/uL (ref 1.7–7.7)
NEUTROS PCT: 62 % (ref 43–77)
Platelets: 327 10*3/uL (ref 150–400)
RBC: 4.57 MIL/uL (ref 3.87–5.11)
RDW: 15.3 % (ref 11.5–15.5)
WBC: 6.9 10*3/uL (ref 4.0–10.5)

## 2013-11-09 LAB — LIPID PANEL
CHOLESTEROL: 183 mg/dL (ref 0–200)
HDL: 53 mg/dL (ref >39)
LDL Cholesterol: 111 mg/dL — ABNORMAL HIGH (ref 0–99)
TRIGLYCERIDES: 96 mg/dL (ref <150)
Total CHOL/HDL Ratio: 3.5 Ratio
VLDL: 19 mg/dL (ref 0–40)

## 2013-11-09 LAB — HEPATIC FUNCTION PANEL
ALBUMIN: 4 g/dL (ref 3.5–5.2)
ALT: 13 U/L (ref 0–35)
AST: 15 U/L (ref 0–37)
Alkaline Phosphatase: 70 U/L (ref 39–117)
Bilirubin, Direct: 0.1 mg/dL (ref 0.0–0.3)
Indirect Bilirubin: 0.3 mg/dL (ref 0.2–1.2)
TOTAL PROTEIN: 7 g/dL (ref 6.0–8.3)
Total Bilirubin: 0.4 mg/dL (ref 0.2–1.2)

## 2013-11-09 LAB — BASIC METABOLIC PANEL WITH GFR
BUN: 8 mg/dL (ref 6–23)
CALCIUM: 9.2 mg/dL (ref 8.4–10.5)
CO2: 27 mEq/L (ref 19–32)
CREATININE: 0.59 mg/dL (ref 0.50–1.10)
Chloride: 103 mEq/L (ref 96–112)
Glucose, Bld: 71 mg/dL (ref 70–99)
Potassium: 4 mEq/L (ref 3.5–5.3)
Sodium: 136 mEq/L (ref 135–145)

## 2013-11-09 MED ORDER — MELATONIN 5 MG PO TABS: ORAL_TABLET | ORAL | Status: DC

## 2013-11-09 NOTE — Assessment & Plan Note (Signed)
Recommended trial of melatonin. Consider trial of ambien if no improvement with melatonin.

## 2013-11-09 NOTE — Assessment & Plan Note (Signed)
Discussed healthy diet, exercise, weight loss. Obtain fasting labs, Tdap today, refer for mammogram.

## 2013-11-09 NOTE — Patient Instructions (Signed)
Please complete lab work prior to leaving. Follow up in 4 months.  Schedule mammogram on the first floor.

## 2013-11-09 NOTE — Assessment & Plan Note (Signed)
BP stable on lisinopril hctz, continue same.

## 2013-11-09 NOTE — Progress Notes (Signed)
Pre visit review using our clinic review tool, if applicable. No additional management support is needed unless otherwise documented below in the visit note. 

## 2013-11-09 NOTE — Assessment & Plan Note (Signed)
Denies current issues with depression or anxiety.

## 2013-11-09 NOTE — Progress Notes (Signed)
Subjective:    Patient ID: Erica Cohen, female    DOB: [birth year], 48 y.o.   MRN: [id number]  HPI  Patient presents today for complete physical.  Immunizations: due for tetanus Diet: reports healthy diet.  Doing herbalife Exercise: walking and joined gym Pap Smear: up to date Mammogram: due  HTN- She continues lisinopril/hctz BP Readings from Last 3 Encounters:  11/09/13 118/82  01/09/13 128/80  10/11/12 120/84   Depression-  Denies depression.  Reports + insomnia.  Takes 30-45 minutes to fall asleep but then wakes up every hour or so.  Denies use of caffeine. Has been walking, joined the gym. Denies current anxiety or panic attacks. She has not tried any medications for sleep.  Had R oophorectomy due to pain.  No improvement in pain.  Considering full hysterectomy.  Denies heavy menstrual bleeding but does have back pain.  Review of Systems  Constitutional: Negative for unexpected weight change.  HENT: Negative for hearing loss and rhinorrhea.   Eyes: Negative for visual disturbance.  Respiratory: Negative for cough and shortness of breath.   Cardiovascular: Negative for chest pain.  Gastrointestinal: Negative for nausea, vomiting and diarrhea.  Genitourinary: Negative for dysuria and frequency.  Musculoskeletal: Positive for back pain.  Neurological:       Occasional headaches  Hematological: Negative for adenopathy.  Psychiatric/Behavioral:       Denies depression/anxiety   Past Medical History  Diagnosis Date  . Hypertension   . Obesity     History   Social History  . Marital Status: Single    Spouse Name: N/A    Number of Children: 3  . Years of Education: N/A   Occupational History  . MED [employer]    Social History Main Topics  . Smoking status: Current Every Day Smoker -- 0.25 packs/day    Types: Cigarettes  . Smokeless tobacco: Never Used  . Alcohol Use: 0.0 oz/week    0 drink(s) per week     Comment: History of Alcohol Abuse. Stopped drinking  in April 2012. Drinks socially (10/11/12)  . 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  Marland Kitchen Sexual Activity: Not Currently    Birth Control/ Protection: None   Other Topics Concern  . Not on file   Social History Narrative   Lives alone. 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   She has 3 grown daughters and 4 grandbabies- daughters are in [place]   Enjoys reading   She is in school for associates degree for medical office administration   single    Past Surgical History  Procedure Laterality Date  . Oophorectomy Right 02/2013    Family History  Problem Relation Age of Onset  . Diabetes Father   . Cancer Mother     ? rare type  . Lupus Sister   . Cancer Maternal Uncle     colon  . Cancer Maternal Grandfather     colon  . Cancer Maternal Uncle     colon    No Known Allergies  Current Outpatient Prescriptions on File Prior to Visit  Medication Sig Dispense Refill  . lisinopril-hydrochlorothiazide (PRINZIDE,ZESTORETIC) 10-12.5 MG per tablet Take 1 tablet by mouth daily.  30 tablet  5  . methylPREDNISolone (MEDROL DOSEPAK) 4 MG tablet follow package directions  21 tablet  0   No current facility-administered medications on file prior to visit.    BP 118/82  Pulse 69  Temp(Src)  98.2 F (36.8 C) (Oral)  Resp 16  Ht 5\' 6"  (1.676 m)  Wt 238 lb 1.3 oz (107.992 kg)  BMI 38.45 kg/m2  SpO2 99%  LMP 11/08/2013       Objective:   Physical Exam  Physical Exam  Constitutional: She is oriented to person, place, and time. She appears well-developed and well-nourished. No distress.  HENT:  Head: Normocephalic and atraumatic.  Right Ear: Tympanic membrane and ear canal normal.  Left Ear: Tympanic membrane and ear canal normal.  Mouth/Throat: Oropharynx is clear and moist.  Eyes: Pupils are equal, round, and reactive to light. No scleral icterus.  Neck: Normal range of motion. No thyromegaly present.  Cardiovascular: Normal rate  and regular rhythm.   No murmur heard. Pulmonary/Chest: Effort normal and breath sounds normal. No respiratory distress. He has no wheezes. She has no rales. She exhibits no tenderness.  Abdominal: Soft. Bowel sounds are normal. He exhibits no distension and no mass. There is no tenderness. There is no rebound and no guarding.  Musculoskeletal: She exhibits no edema.  Lymphadenopathy:    She has no cervical adenopathy.  Neurological: She is alert and oriented to person, place, and time.  She exhibits normal muscle tone. Coordination normal.  Skin: Skin is warm and dry.  Psychiatric: She has a normal mood and affect. Her behavior is normal. Judgment and thought content normal.  Breasts: Examined lying Right: Without masses, retractions, discharge or axillary adenopathy.  Left: Without masses, retractions, discharge or axillary adenopathy.  Pelvic: Deferred         Assessment & Plan:         Assessment & Plan:

## 2013-11-10 LAB — TSH: TSH: 2.782 u[IU]/mL (ref 0.350–4.500)

## 2013-11-10 LAB — URINALYSIS, ROUTINE W REFLEX MICROSCOPIC
Bilirubin Urine: NEGATIVE
GLUCOSE, UA: NEGATIVE mg/dL
Hgb urine dipstick: NEGATIVE
Ketones, ur: NEGATIVE mg/dL
Leukocytes, UA: NEGATIVE
Nitrite: NEGATIVE
PH: 7.5 (ref 5.0–8.0)
Protein, ur: NEGATIVE mg/dL
SPECIFIC GRAVITY, URINE: 1.019 (ref 1.005–1.030)
Urobilinogen, UA: 0.2 mg/dL (ref 0.0–1.0)

## 2013-11-12 ENCOUNTER — Encounter: Payer: Self-pay | Admitting: Family

## 2013-11-12 ENCOUNTER — Telehealth: Payer: Self-pay | Admitting: Family

## 2013-11-12 NOTE — Telephone Encounter (Signed)
Relevant patient education assigned to patient using Emmi. ° °

## 2013-11-14 ENCOUNTER — Ambulatory Visit (HOSPITAL_BASED_OUTPATIENT_CLINIC_OR_DEPARTMENT_OTHER): Payer: Self-pay

## 2013-11-26 ENCOUNTER — Telehealth: Payer: Self-pay | Admitting: *Deleted

## 2013-11-26 MED ORDER — LISINOPRIL-HYDROCHLOROTHIAZIDE 10-12.5 MG PO TABS: 1.0000 | ORAL_TABLET | Freq: Every day | ORAL | Status: DC

## 2013-11-26 NOTE — Telephone Encounter (Signed)
Pt left message requesting refill of lisinopril hctz. Refill sent, notified pt.

## 2013-11-28 ENCOUNTER — Inpatient Hospital Stay (HOSPITAL_BASED_OUTPATIENT_CLINIC_OR_DEPARTMENT_OTHER): Admission: RE | Admit: 2013-11-28 | Payer: Self-pay | Source: Ambulatory Visit

## 2014-03-06 ENCOUNTER — Ambulatory Visit: Payer: Self-pay | Admitting: Family

## 2014-05-27 ENCOUNTER — Encounter: Payer: Self-pay | Admitting: Family

## 2014-12-10 ENCOUNTER — Telehealth: Payer: Self-pay | Admitting: Family

## 2014-12-10 NOTE — Telephone Encounter (Signed)
Pre Visit letter sent  °

## 2014-12-30 ENCOUNTER — Telehealth: Payer: Self-pay

## 2014-12-30 NOTE — Telephone Encounter (Signed)
See speciality notes 

## 2014-12-31 ENCOUNTER — Ambulatory Visit (INDEPENDENT_AMBULATORY_CARE_PROVIDER_SITE_OTHER): Payer: BLUE CROSS/BLUE SHIELD | Admitting: Family

## 2014-12-31 ENCOUNTER — Encounter: Payer: Self-pay | Admitting: Family

## 2014-12-31 VITALS — BP 114/70 | HR 87 | Temp 98.6°F | Resp 16 | Ht 67.0 in | Wt 249.0 lb

## 2014-12-31 DIAGNOSIS — I10 Essential (primary) hypertension: Secondary | ICD-10-CM

## 2014-12-31 DIAGNOSIS — M5417 Radiculopathy, lumbosacral region: Secondary | ICD-10-CM | POA: Diagnosis not present

## 2014-12-31 DIAGNOSIS — Z0000 Encounter for general adult medical examination without abnormal findings: Secondary | ICD-10-CM

## 2014-12-31 DIAGNOSIS — M5416 Radiculopathy, lumbar region: Secondary | ICD-10-CM

## 2014-12-31 DIAGNOSIS — M542 Cervicalgia: Secondary | ICD-10-CM | POA: Insufficient documentation

## 2014-12-31 LAB — CBC WITH DIFFERENTIAL/PLATELET
Basophils Absolute: 0.1 K/uL (ref 0.0–0.1)
Basophils Relative: 1.1 % (ref 0.0–3.0)
Eosinophils Absolute: 0.1 K/uL (ref 0.0–0.7)
Eosinophils Relative: 2.1 % (ref 0.0–5.0)
HCT: 44.1 % (ref 36.0–46.0)
Hemoglobin: 14.6 g/dL (ref 12.0–15.0)
Lymphocytes Relative: 32.6 % (ref 12.0–46.0)
Lymphs Abs: 2.1 K/uL (ref 0.7–4.0)
MCHC: 33.2 g/dL (ref 30.0–36.0)
MCV: 87.7 fl (ref 78.0–100.0)
Monocytes Absolute: 0.3 K/uL (ref 0.1–1.0)
Monocytes Relative: 5.1 % (ref 3.0–12.0)
Neutro Abs: 3.8 K/uL (ref 1.4–7.7)
Neutrophils Relative %: 59.1 % (ref 43.0–77.0)
Platelets: 312 K/uL (ref 150.0–400.0)
RBC: 5.03 Mil/uL (ref 3.87–5.11)
RDW: 14.8 % (ref 11.5–15.5)
WBC: 6.4 K/uL (ref 4.0–10.5)

## 2014-12-31 LAB — HEPATIC FUNCTION PANEL
ALT: 23 U/L (ref 0–35)
AST: 19 U/L (ref 0–37)
Albumin: 4 g/dL (ref 3.5–5.2)
Alkaline Phosphatase: 76 U/L (ref 39–117)
Bilirubin, Direct: 0 mg/dL (ref 0.0–0.3)
Total Bilirubin: 0.3 mg/dL (ref 0.2–1.2)
Total Protein: 7.6 g/dL (ref 6.0–8.3)

## 2014-12-31 LAB — BASIC METABOLIC PANEL
BUN: 8 mg/dL (ref 6–23)
CO2: 24 meq/L (ref 19–32)
Calcium: 9.4 mg/dL (ref 8.4–10.5)
Chloride: 103 mEq/L (ref 96–112)
Creatinine, Ser: 0.71 mg/dL (ref 0.40–1.20)
GFR: 113.74 mL/min (ref >60.00)
GLUCOSE: 83 mg/dL (ref 70–99)
Potassium: 3.9 mEq/L (ref 3.5–5.1)
Sodium: 134 mEq/L — ABNORMAL LOW (ref 135–145)

## 2014-12-31 LAB — URINALYSIS, ROUTINE W REFLEX MICROSCOPIC
Bilirubin Urine: NEGATIVE
Leukocytes, UA: NEGATIVE
NITRITE: NEGATIVE
PH: 6 (ref 5.0–8.0)
Specific Gravity, Urine: 1.02 (ref 1.000–1.030)
Total Protein, Urine: NEGATIVE
URINE GLUCOSE: NEGATIVE
UROBILINOGEN UA: 0.2 (ref 0.0–1.0)

## 2014-12-31 LAB — TSH: TSH: 2.48 u[IU]/mL (ref 0.35–4.50)

## 2014-12-31 MED ORDER — MELOXICAM 7.5 MG PO TABS: 7.5000 mg | ORAL_TABLET | Freq: Every day | ORAL | Status: DC

## 2014-12-31 MED ORDER — AMLODIPINE BESYLATE 5 MG PO TABS: 5.0000 mg | ORAL_TABLET | Freq: Every day | ORAL | Status: DC

## 2014-12-31 MED ORDER — CYCLOBENZAPRINE HCL 5 MG PO TABS: 5.0000 mg | ORAL_TABLET | Freq: Every evening | ORAL | Status: DC | PRN

## 2014-12-31 NOTE — Assessment & Plan Note (Signed)
Trial of meloxicam, flexeril HS, x ray cspine

## 2014-12-31 NOTE — Progress Notes (Signed)
Subjective:    Patient ID: Erica Cohen, female    DOB: 1965-11-30, 49 y.o.   MRN: [id number]  HPI   Erica Cohen is a 49 yr old female who presents today for cpx.  Immunizations: Tdap is up to date 4/15 Diet: went to a weight loss clinic Wt Readings from Last 3 Encounters:  12/31/14 249 lb (112.946 kg)  11/09/13 238 lb 1.3 oz (107.992 kg)  01/09/13 238 lb 6.4 oz (108.138 kg)  Exercise: trying to walk but having some low back pain Pap Smear: 5/16- normal per patient Mammogram: due   Low back pain- reports pain has been present x >1 year and is worsening.  Radiates down the back of the right leg. Reports tingling and numbness in the right leg.    Right sided neck pain- neck pain, occasional right arm numbness.  HTN- Reports good compliance with lisinopril-hctz.  Reports + compliance with low sodium diet. Denies CP/SOB or swelling.     Review of Systems Past Medical History  Diagnosis Date  . Hypertension   . Obesity     History   Social History  . Marital Status: Single    Spouse Name: N/A  . Number of Children: 3  . Years of Education: N/A   Occupational History  . MED [employer]    Social History Main Topics  . Smoking status: Current Every Day Smoker -- 0.25 packs/day    Types: Cigarettes  . Smokeless tobacco: Never Used  . Alcohol Use: 0.0 oz/week    0 drink(s) per week     Comment: History of Alcohol Abuse. Stopped drinking in April 2012. Drinks socially (10/11/12)  . 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  Marland Kitchen Sexual Activity: Not Currently    Birth Control/ Protection: None   Other Topics Concern  . Not on file   Social History Narrative   Lives alone. 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   She has 3 grown daughters and 4 grandbabies- daughters are in [place]   Enjoys reading   She is in school for associates degree for medical office administration   Single   Sister is Erica Cohen    Past  Surgical History  Procedure Laterality Date  . Oophorectomy Right 02/2013    Family History  Problem Relation Age of Onset  . Diabetes Father   . Cancer Mother     ? rare type  . Lupus Sister   . Cancer Maternal Uncle     colon  . Cancer Maternal Grandfather     colon  . Cancer Maternal Uncle     colon    No Known Allergies  Current Outpatient Prescriptions on File Prior to Visit  Medication Sig Dispense Refill  . lisinopril-hydrochlorothiazide (PRINZIDE,ZESTORETIC) 10-12.5 MG per tablet Take 1 tablet by mouth daily. 30 tablet 4   No current facility-administered medications on file prior to visit.    BP 114/70 mmHg  Pulse 87  Temp(Src) 98.6 F (37 C) (Oral)  Resp 16  Ht 5\' 7"  (1.702 m)  Wt 249 lb (112.946 kg)  BMI 38.99 kg/m2  SpO2 97%  LMP 12/30/2014       Objective:   Physical Exam  Physical Exam  Constitutional: She is oriented to person, place, and time. She appears well-developed and well-nourished. No distress.  HENT:  Head: Normocephalic and atraumatic.  Right Ear: Tympanic membrane and ear canal normal.  Left Ear:  Tympanic membrane and ear canal normal.  Mouth/Throat: Oropharynx is clear and moist.  Eyes: Pupils are equal, round, and reactive to light. No scleral icterus.  Neck: Normal range of motion. No thyromegaly present.  Cardiovascular: Normal rate and regular rhythm.   No murmur heard. Pulmonary/Chest: Effort normal and breath sounds normal. No respiratory distress. He has no wheezes. She has no rales. She exhibits no tenderness.  Abdominal: Soft. Bowel sounds are normal. He exhibits no distension and no mass. There is no tenderness. There is no rebound and no guarding.  Musculoskeletal: She exhibits no edema.  Lymphadenopathy:    She has no cervical adenopathy.  Neurological: She is alert and oriented to person, place, and time. She has normal patellar reflexes. She exhibits normal muscle tone. Coordination normal. bilateral UE/LE strength  is 5/5 Skin: Skin is warm and dry.  Psychiatric: She has a normal mood and affect. Her behavior is normal. Judgment and thought content normal.  Breasts: Examined lying Right: Without masses, retractions, discharge or axillary adenopathy.  Left: Without masses, retractions, discharge or axillary adenopathy.  Pelvic: deferred to GYN     Assessment & Plan:         Assessment & Plan:

## 2014-12-31 NOTE — Patient Instructions (Addendum)
Complete lab work prior to leaving. Stop lisinopril-hctz. Start amlodpine (norvasc) once daily for blood pressure. Complete x-rays on the first.  Follow up in 2-3 weeks so we can recheck your blood pressure.

## 2014-12-31 NOTE — Addendum Note (Signed)
Addended by: Harl Bowie on: 12/31/2014 08:16 AM   Modules accepted: Orders

## 2014-12-31 NOTE — Assessment & Plan Note (Signed)
Discussed exercise, weight loss.  Immunizations reviewed and up to date.   Obtain routine labs. Refer for mammogram.

## 2014-12-31 NOTE — Assessment & Plan Note (Signed)
Not using birth control, therefore will d/c ACE. Start amlodipine, follow up in 2 months.

## 2014-12-31 NOTE — Assessment & Plan Note (Signed)
Rx with meloxicam, flexeril, obtain x ray lumbar spine

## 2014-12-31 NOTE — Progress Notes (Signed)
Pre visit review using our clinic review tool, if applicable. No additional management support is needed unless otherwise documented below in the visit note. 

## 2015-01-01 LAB — HIV ANTIBODY (ROUTINE TESTING W REFLEX): HIV: NONREACTIVE

## 2015-01-02 ENCOUNTER — Encounter: Payer: Self-pay | Admitting: Family

## 2015-01-06 ENCOUNTER — Telehealth: Payer: Self-pay | Admitting: *Deleted

## 2015-01-06 DIAGNOSIS — E871 Hypo-osmolality and hyponatremia: Secondary | ICD-10-CM

## 2015-01-06 NOTE — Telephone Encounter (Signed)
Notified pt and scheduled lab appt for 01/20/15. Future lab order entered.

## 2015-01-06 NOTE — Telephone Encounter (Signed)
-----   Message from Debbrah Alar, NP sent at 01/02/2015 10:01 AM EDT ----- Results look good except sodium is mildly low. Repeat bmet in 2 weeks. Dx hyponatremia.

## 2015-01-20 ENCOUNTER — Other Ambulatory Visit (INDEPENDENT_AMBULATORY_CARE_PROVIDER_SITE_OTHER): Payer: BLUE CROSS/BLUE SHIELD

## 2015-01-20 ENCOUNTER — Ambulatory Visit (HOSPITAL_BASED_OUTPATIENT_CLINIC_OR_DEPARTMENT_OTHER)
Admission: RE | Admit: 2015-01-20 | Discharge: 2015-01-20 | Disposition: A | Discharge status: Home / Self Care | Payer: BLUE CROSS/BLUE SHIELD | Source: Ambulatory Visit | Attending: Family | Admitting: Family

## 2015-01-20 ENCOUNTER — Telehealth: Payer: Self-pay | Admitting: Family

## 2015-01-20 DIAGNOSIS — E871 Hypo-osmolality and hyponatremia: Secondary | ICD-10-CM

## 2015-01-20 DIAGNOSIS — Z0000 Encounter for general adult medical examination without abnormal findings: Secondary | ICD-10-CM | POA: Diagnosis not present

## 2015-01-20 DIAGNOSIS — M5416 Radiculopathy, lumbar region: Secondary | ICD-10-CM

## 2015-01-20 DIAGNOSIS — M542 Cervicalgia: Secondary | ICD-10-CM

## 2015-01-20 DIAGNOSIS — M5417 Radiculopathy, lumbosacral region: Secondary | ICD-10-CM | POA: Insufficient documentation

## 2015-01-20 DIAGNOSIS — Z1231 Encounter for screening mammogram for malignant neoplasm of breast: Secondary | ICD-10-CM | POA: Insufficient documentation

## 2015-01-20 DIAGNOSIS — M539 Dorsopathy, unspecified: Secondary | ICD-10-CM

## 2015-01-20 LAB — LIPID PANEL
CHOL/HDL RATIO: 5
CHOLESTEROL: 204 mg/dL — AB (ref 0–200)
HDL: 43.1 mg/dL (ref >39.00)
LDL CALC: 144 mg/dL — AB (ref 0–99)
NonHDL: 160.9
Triglycerides: 85 mg/dL (ref 0.0–149.0)
VLDL: 17 mg/dL (ref 0.0–40.0)

## 2015-01-20 LAB — BASIC METABOLIC PANEL
BUN: 11 mg/dL (ref 6–23)
CO2: 26 mEq/L (ref 19–32)
Calcium: 9.6 mg/dL (ref 8.4–10.5)
Chloride: 105 mEq/L (ref 96–112)
Creatinine, Ser: 0.77 mg/dL (ref 0.40–1.20)
GFR: 103.55 mL/min (ref >60.00)
Glucose, Bld: 86 mg/dL (ref 70–99)
POTASSIUM: 3.9 meq/L (ref 3.5–5.1)
SODIUM: 137 meq/L (ref 135–145)

## 2015-01-20 NOTE — Telephone Encounter (Signed)
-----   Message from Erica Cohen, [place] sent at 01/20/2015  6:26 PM EDT ----- Patient informed of results and PCP instructions.  She would like a referral for PT

## 2015-01-21 ENCOUNTER — Encounter: Payer: Self-pay | Admitting: Family

## 2015-01-21 ENCOUNTER — Ambulatory Visit (INDEPENDENT_AMBULATORY_CARE_PROVIDER_SITE_OTHER): Payer: BLUE CROSS/BLUE SHIELD | Admitting: Family

## 2015-01-21 VITALS — BP 120/84 | HR 92 | Temp 98.0°F | Ht 66.0 in | Wt 244.1 lb

## 2015-01-21 DIAGNOSIS — I10 Essential (primary) hypertension: Secondary | ICD-10-CM

## 2015-01-21 MED ORDER — MELOXICAM 7.5 MG PO TABS: 7.5000 mg | ORAL_TABLET | Freq: Every day | ORAL | Status: DC

## 2015-01-21 MED ORDER — CYCLOBENZAPRINE HCL 5 MG PO TABS: 5.0000 mg | ORAL_TABLET | Freq: Every evening | ORAL | Status: DC | PRN

## 2015-01-21 MED ORDER — AMLODIPINE BESYLATE 5 MG PO TABS: 5.0000 mg | ORAL_TABLET | Freq: Every day | ORAL | Status: AC

## 2015-01-21 NOTE — Patient Instructions (Signed)
Please complete lab work prior to leaving. Follow up in 4 months.  

## 2015-01-21 NOTE — Progress Notes (Signed)
Subjective:    Patient ID: Erica Cohen, female    DOB: 10-Jul-1966, 49 y.o.   MRN: [id number]  HPI  Ms.  Petrey is a 49 yr old female who presents today for follow up of her blood pressure.  Patient is currently maintained on the following medications for blood pressure: amlodipine 5mg  (ACE was d/c'd last visit) Patient reports good compliance with blood pressure medications. Patient denies chest pain, shortness of breath or swelling. Last 3 blood pressure readings in our office are as follows:   BP Readings from Last 3 Encounters:  01/21/15 120/84  12/31/14 114/70  11/09/13 118/82    Review of Systems    see HPI  Past Medical History  Diagnosis Date  . Hypertension   . Obesity     History   Social History  . Marital Status: Single    Spouse Name: N/A  . Number of Children: 3  . Years of Education: N/A   Occupational History  . [employer]    Social History Main Topics  . Smoking status: Current Every Day Smoker -- 0.25 packs/day    Types: Cigarettes  . Smokeless tobacco: Never Used  . Alcohol Use: 0.0 oz/week    0 drink(s) per week     Comment: History of Alcohol Abuse. Stopped drinking in April 2012. Drinks socially (10/11/12)  . 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  Marland Kitchen Sexual Activity: Not Currently    Birth Control/ Protection: None   Other Topics Concern  . Not on file   Social History Narrative   Lives alone. 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   She has 3 grown daughters and 4 grandbabies- daughters are in [place]   Enjoys reading   She is in school for associates degree for medical office administration   Single   Sister is Erica Cohen    Past Surgical History  Procedure Laterality Date  . Oophorectomy Right 02/2013    Family History  Problem Relation Age of Onset  . Diabetes Father   . Cancer Mother     ? rare type  . Lupus Sister   . Cancer Maternal Uncle     colon  .  Cancer Maternal Grandfather     colon  . Cancer Maternal Uncle     colon    No Known Allergies  Current Outpatient Prescriptions on File Prior to Visit  Medication Sig Dispense Refill  . amLODipine (NORVASC) 5 MG tablet Take 1 tablet (5 mg total) by mouth daily. 30 tablet 0  . cyclobenzaprine (FLEXERIL) 5 MG tablet Take 1 tablet (5 mg total) by mouth at bedtime as needed for muscle spasms. 14 tablet 0  . meloxicam (MOBIC) 7.5 MG tablet Take 1 tablet (7.5 mg total) by mouth daily. 14 tablet 0  . phentermine 37.5 MG capsule Take 37.5 mg by mouth every morning.     No current facility-administered medications on file prior to visit.    BP 120/84 mmHg  Pulse 92  Temp(Src) 98 F (36.7 C) (Oral)  Ht 5\' 6"  (1.676 m)  Wt 244 lb 2 oz (110.734 kg)  BMI 39.42 kg/m2  SpO2 99%  LMP 12/30/2014    Objective:   Physical Exam  Constitutional: She is oriented to person, place, and time. She appears well-developed and well-nourished.  HENT:  Head: Normocephalic and atraumatic.  Cardiovascular: Normal rate, regular rhythm and normal heart sounds.   No  murmur heard. Pulmonary/Chest: Effort normal and breath sounds normal. No respiratory distress. She has no wheezes.  Musculoskeletal: She exhibits no edema.  Neurological: She is alert and oriented to person, place, and time.  Psychiatric: She has a normal mood and affect. Her behavior is normal. Judgment and thought content normal.          Assessment & Plan:

## 2015-01-21 NOTE — Progress Notes (Signed)
Pre visit review using our clinic review tool, if applicable. No additional management support is needed unless otherwise documented below in the visit note. 

## 2015-01-21 NOTE — Assessment & Plan Note (Signed)
BP looks stable.  Continue amlodipine.

## 2015-01-23 ENCOUNTER — Encounter: Payer: Self-pay | Admitting: Family

## 2015-04-09 ENCOUNTER — Other Ambulatory Visit: Payer: Self-pay | Admitting: Family

## 2015-04-09 NOTE — Telephone Encounter (Signed)
Please advise meloxicam and cyclobenzaprine requests?

## 2015-04-09 NOTE — Telephone Encounter (Signed)
Ok

## 2015-04-09 NOTE — Telephone Encounter (Signed)
Refills sent

## 2015-05-16 ENCOUNTER — Ambulatory Visit: Payer: BLUE CROSS/BLUE SHIELD | Admitting: Family

## 2015-05-16 DIAGNOSIS — Z0289 Encounter for other administrative examinations: Secondary | ICD-10-CM

## 2015-05-28 ENCOUNTER — Telehealth: Payer: Self-pay | Admitting: Family

## 2015-05-28 NOTE — Telephone Encounter (Signed)
Yes please

## 2015-05-28 NOTE — Telephone Encounter (Signed)
Pt was no show 05/16/15 7:00am follow up appt, pt has not rescheduled, 1st no show I see, charge or no charge?

## 2017-06-10 IMAGING — DX DG CERVICAL SPINE COMPLETE 4+V
6 series · 6 of 6 positions shown · non-contrast
Comparison: 01/14/2009

CLINICAL DATA: Right-sided neck pain for several months, no known
injury, initial encounter

EXAM:
CERVICAL SPINE  4+ VIEWS

[c-spine lat]
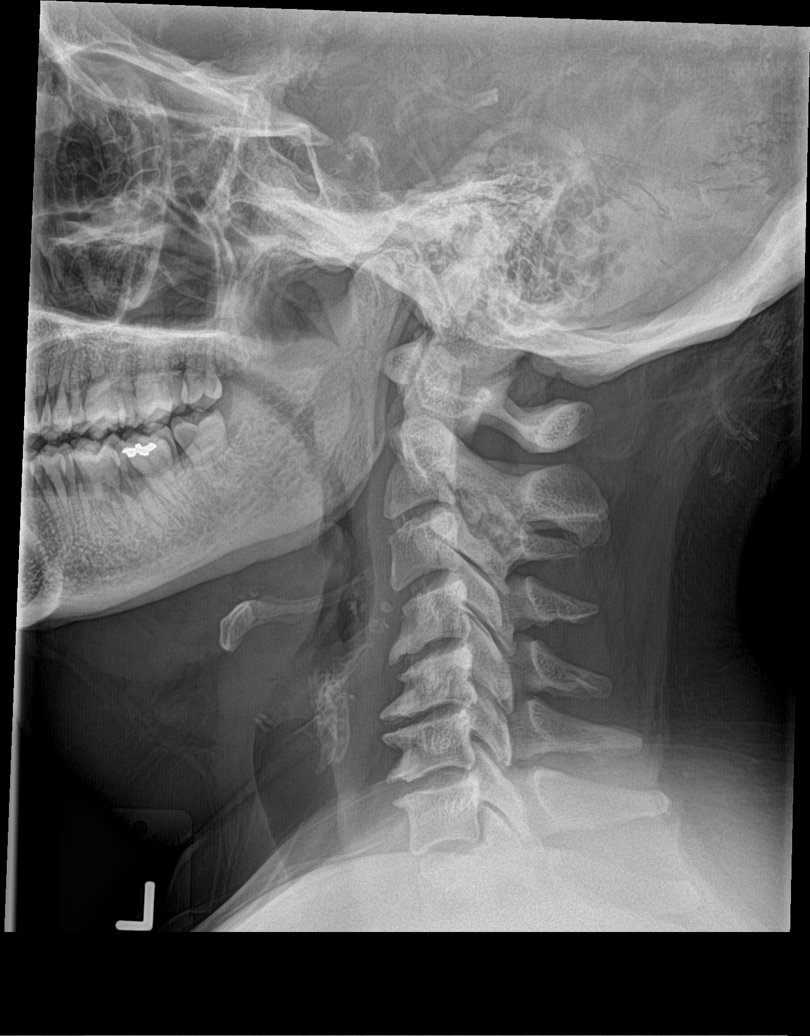

[c-spine obl (1 of 2)]
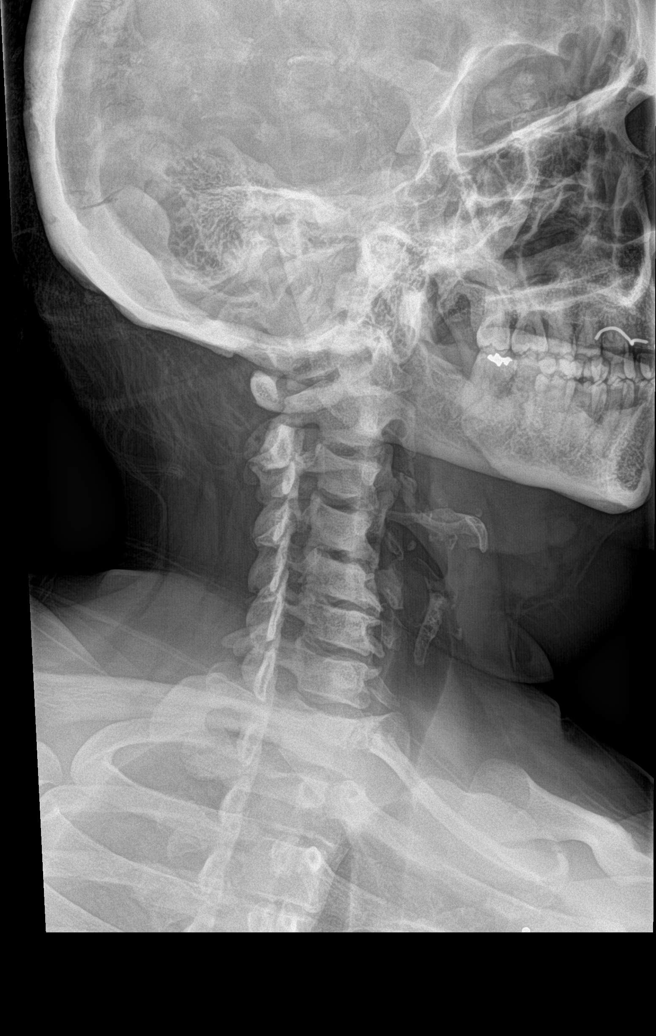

[c-spine obl (2 of 2)]
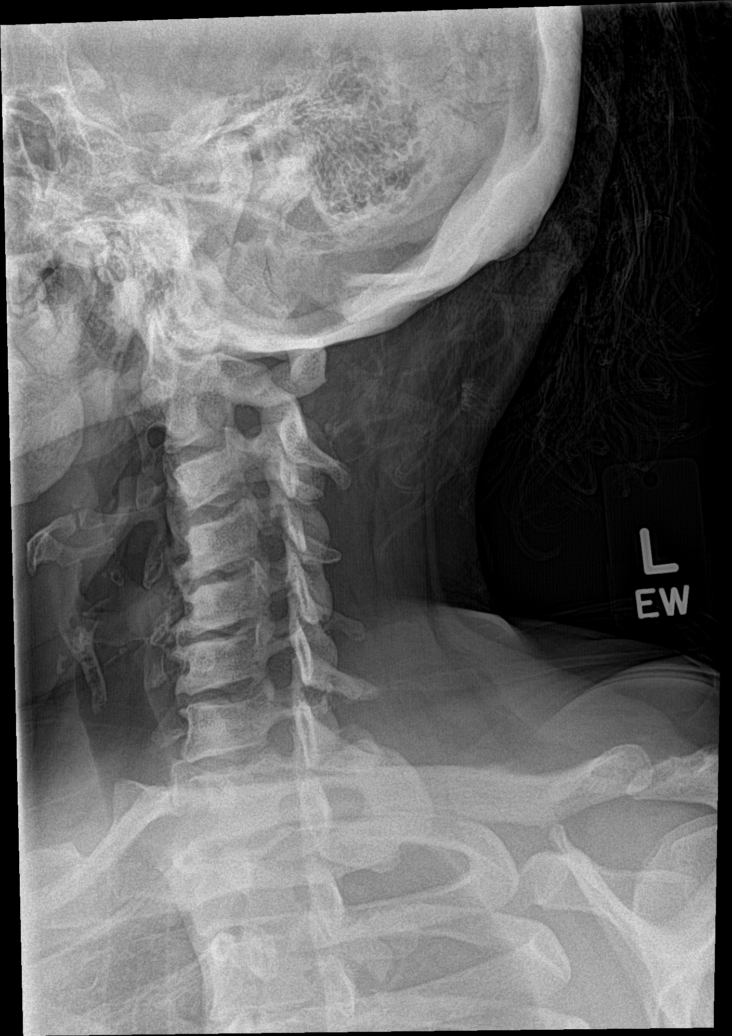

[c-spine ap]
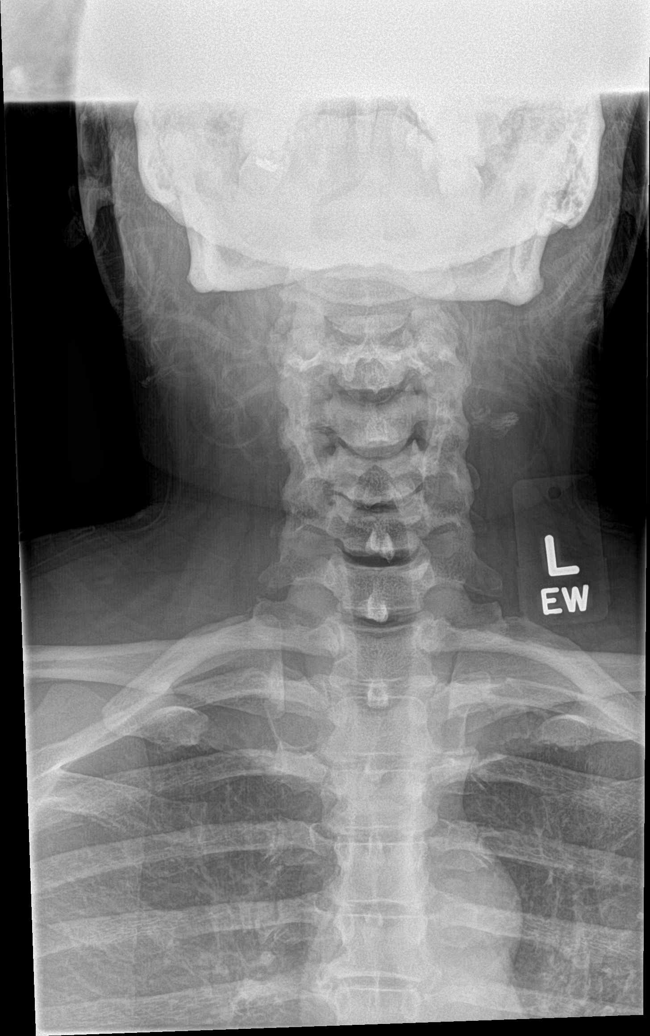

[c-spine open mouth]
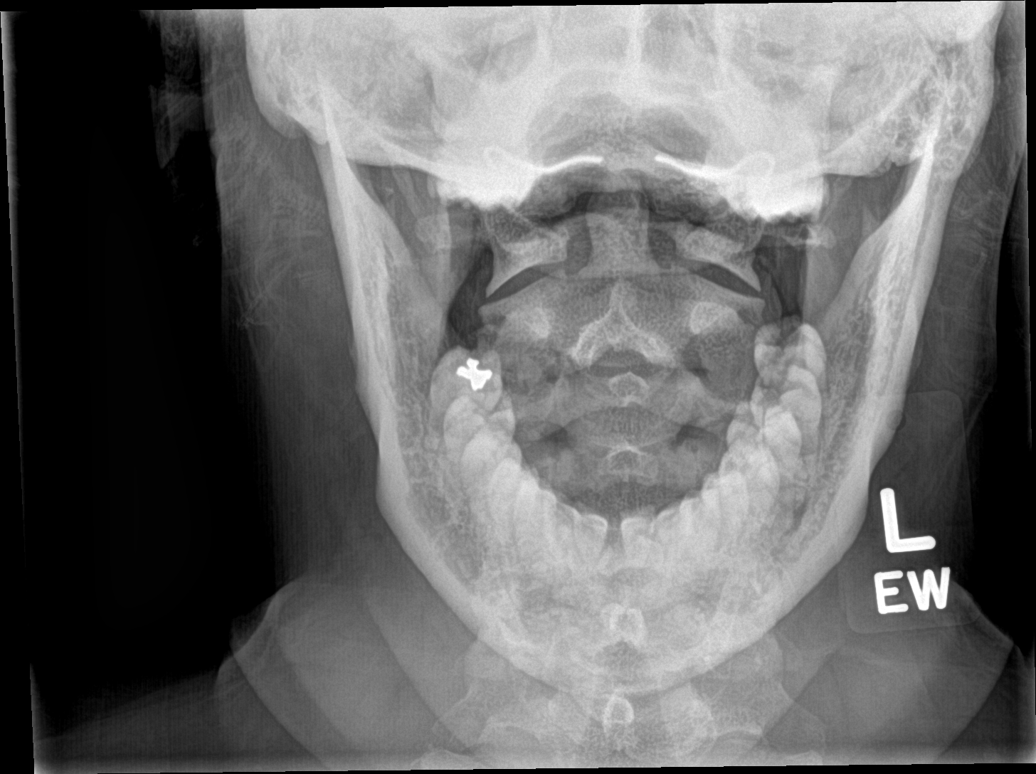

[c-spine swimmers]
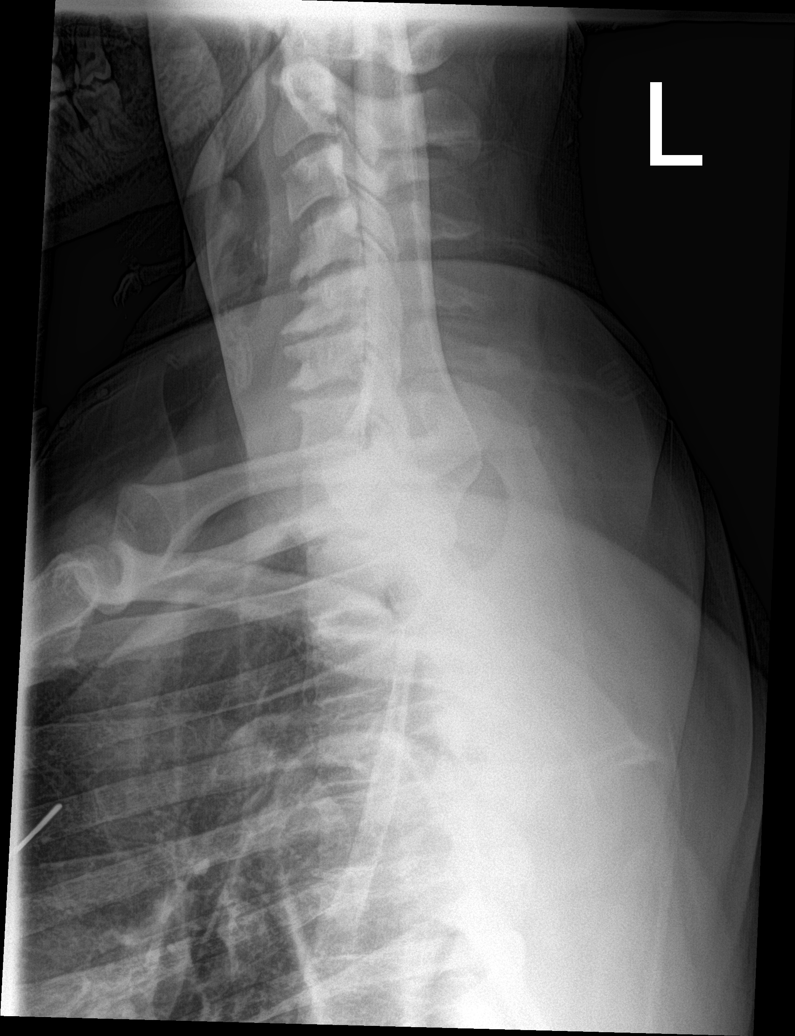

[6 of 6 positions shown; findings below may reference images not displayed]

FINDINGS: Seven cervical segments are well visualized. Vertebral body height
is well maintained. Slight increase in the degree of osteophytic
changes is noted, particularly at C4-5, C5-6 and C6-7. No
significant neural foraminal narrowing is seen. The odontoid is
within normal limits. No gross soft tissue abnormality is seen.
IMPRESSION: Multilevel degenerative changes.  No acute abnormality is noted.

## 2018-09-07 ENCOUNTER — Ambulatory Visit: Payer: Self-pay | Admitting: Medical

## 2018-09-07 DIAGNOSIS — Z0289 Encounter for other administrative examinations: Secondary | ICD-10-CM
